# Patient Record
Sex: Female | Born: 1965 | Race: Black or African American | Hispanic: No | Smoking: Never smoker
Health system: Southern US, Community
[De-identification: ages and names within clinical notes are randomized; demographics above are authoritative.]

## PROBLEM LIST (undated history)

## (undated) DIAGNOSIS — R42 Dizziness and giddiness: Secondary | ICD-10-CM

## (undated) DIAGNOSIS — J45909 Unspecified asthma, uncomplicated: Secondary | ICD-10-CM

---

## 2017-04-10 ENCOUNTER — Emergency Department (HOSPITAL_COMMUNITY)
Admission: EM | Admit: 2017-04-10 | Discharge: 2017-04-10 | Disposition: A | Discharge status: Home / Self Care | Payer: No Typology Code available for payment source | Attending: Emergency Medicine | Admitting: Emergency Medicine

## 2017-04-10 ENCOUNTER — Emergency Department (HOSPITAL_COMMUNITY): Payer: No Typology Code available for payment source

## 2017-04-10 ENCOUNTER — Encounter (HOSPITAL_COMMUNITY): Payer: Self-pay

## 2017-04-10 DIAGNOSIS — Y9389 Activity, other specified: Secondary | ICD-10-CM | POA: Insufficient documentation

## 2017-04-10 DIAGNOSIS — R519 Headache, unspecified: Secondary | ICD-10-CM

## 2017-04-10 DIAGNOSIS — R51 Headache: Secondary | ICD-10-CM | POA: Diagnosis not present

## 2017-04-10 DIAGNOSIS — Y998 Other external cause status: Secondary | ICD-10-CM | POA: Insufficient documentation

## 2017-04-10 DIAGNOSIS — J45909 Unspecified asthma, uncomplicated: Secondary | ICD-10-CM | POA: Insufficient documentation

## 2017-04-10 DIAGNOSIS — M542 Cervicalgia: Secondary | ICD-10-CM | POA: Diagnosis not present

## 2017-04-10 DIAGNOSIS — Y92410 Unspecified street and highway as the place of occurrence of the external cause: Secondary | ICD-10-CM | POA: Insufficient documentation

## 2017-04-10 HISTORY — DX: Dizziness and giddiness: R42

## 2017-04-10 HISTORY — DX: Unspecified asthma, uncomplicated: J45.909

## 2017-04-10 MED ORDER — NAPROXEN 500 MG PO TABS: 500.0000 mg | ORAL_TABLET | Freq: Two times a day (BID) | ORAL | 0 refills | Status: DC

## 2017-04-10 MED ORDER — CYCLOBENZAPRINE HCL 10 MG PO TABS: 10.0000 mg | ORAL_TABLET | Freq: Two times a day (BID) | ORAL | 0 refills | Status: DC | PRN

## 2017-04-10 MED ORDER — IBUPROFEN 800 MG PO TABS
800.0000 mg | ORAL_TABLET | Freq: Once | ORAL | Status: AC
  Administered 2017-04-10: 800 mg via ORAL
  Filled 2017-04-10: qty 1

## 2017-04-10 NOTE — Discharge Instructions (Signed)
X-rays of your head and neck were normal. Prescription for pain medicine and muscle relaxer. You will be sore for several days.
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
## 2017-04-10 NOTE — ED Notes (Signed)
Pt verbalized understanding discharge instructions and denies any further needs or questions at this time. VS stable, ambulatory and steady gait.   
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
## 2018-11-23 ENCOUNTER — Encounter (HOSPITAL_COMMUNITY): Payer: Self-pay

## 2018-11-23 ENCOUNTER — Emergency Department (HOSPITAL_COMMUNITY)
Admission: EM | Admit: 2018-11-23 | Discharge: 2018-11-24 | Disposition: A | Discharge status: Home / Self Care | Payer: Medicaid Other | Attending: Emergency Medicine | Admitting: Emergency Medicine

## 2018-11-23 ENCOUNTER — Other Ambulatory Visit: Payer: Self-pay

## 2018-11-23 ENCOUNTER — Emergency Department (HOSPITAL_COMMUNITY): Payer: Medicaid Other

## 2018-11-23 DIAGNOSIS — M79601 Pain in right arm: Secondary | ICD-10-CM | POA: Diagnosis not present

## 2018-11-23 DIAGNOSIS — R05 Cough: Secondary | ICD-10-CM | POA: Diagnosis not present

## 2018-11-23 DIAGNOSIS — Z79899 Other long term (current) drug therapy: Secondary | ICD-10-CM | POA: Insufficient documentation

## 2018-11-23 DIAGNOSIS — M79662 Pain in left lower leg: Secondary | ICD-10-CM | POA: Insufficient documentation

## 2018-11-23 DIAGNOSIS — R2231 Localized swelling, mass and lump, right upper limb: Secondary | ICD-10-CM | POA: Insufficient documentation

## 2018-11-23 DIAGNOSIS — R112 Nausea with vomiting, unspecified: Secondary | ICD-10-CM | POA: Diagnosis not present

## 2018-11-23 DIAGNOSIS — J45909 Unspecified asthma, uncomplicated: Secondary | ICD-10-CM | POA: Diagnosis not present

## 2018-11-23 DIAGNOSIS — R0981 Nasal congestion: Secondary | ICD-10-CM | POA: Diagnosis not present

## 2018-11-23 DIAGNOSIS — R0789 Other chest pain: Secondary | ICD-10-CM | POA: Diagnosis not present

## 2018-11-23 LAB — BASIC METABOLIC PANEL
Anion gap: 8 (ref 5–15)
BUN: 17 mg/dL (ref 6–20)
CHLORIDE: 104 mmol/L (ref 98–111)
CO2: 23 mmol/L (ref 22–32)
Calcium: 8.9 mg/dL (ref 8.9–10.3)
Creatinine, Ser: 0.9 mg/dL (ref 0.44–1.00)
GFR calc Af Amer: >60 mL/min (ref >60)
GLUCOSE: 184 mg/dL — AB (ref 70–99)
POTASSIUM: 3.6 mmol/L (ref 3.5–5.1)
Sodium: 135 mmol/L (ref 135–145)

## 2018-11-23 LAB — CBC
HEMATOCRIT: 40.8 % (ref 36.0–46.0)
HEMOGLOBIN: 12.7 g/dL (ref 12.0–15.0)
MCH: 26.1 pg (ref 26.0–34.0)
MCHC: 31.1 g/dL (ref 30.0–36.0)
MCV: 83.8 fL (ref 80.0–100.0)
Platelets: 350 10*3/uL (ref 150–400)
RBC: 4.87 MIL/uL (ref 3.87–5.11)
RDW: 13.5 % (ref 11.5–15.5)
WBC: 13.7 10*3/uL — ABNORMAL HIGH (ref 4.0–10.5)
nRBC: 0 % (ref 0.0–0.2)

## 2018-11-23 LAB — I-STAT TROPONIN, ED: Troponin i, poc: 0 ng/mL (ref 0.00–0.08)

## 2018-11-23 MED ORDER — SODIUM CHLORIDE 0.9% FLUSH: 3.0000 mL | Freq: Once | INTRAVENOUS | Status: DC

## 2018-11-23 NOTE — ED Triage Notes (Signed)
Pt reports chest pain since yesterday and right forearm pain that has a lump there. Pt first noticed lump on her forearm on Friday. Pt a.o, nad noted.

## 2018-11-24 LAB — I-STAT TROPONIN, ED: TROPONIN I, POC: 0.01 ng/mL (ref 0.00–0.08)

## 2018-11-24 MED ORDER — AZITHROMYCIN 250 MG PO TABS: 250.0000 mg | ORAL_TABLET | Freq: Every day | ORAL | 0 refills | Status: DC

## 2018-11-24 NOTE — Discharge Instructions (Signed)
Follow up with a PCP.  Return for sudden worsening symptoms.   Take 4 over the counter ibuprofen tablets 3 times a day or 2 over-the-counter naproxen tablets twice a day for pain. Also take tylenol 1000mg (2 extra strength) four times a day.

## 2018-11-24 NOTE — ED Provider Notes (Signed)
[facility] EMERGENCY DEPARTMENT Provider Note   CSN: [id number] Arrival date & time: 11/23/18  2003    History   Chief Complaint Chief Complaint  Patient presents with  . Chest Pain  . Arm Pain    HPI Carly Flores is a 53 y.o. female.     53 yo F with a chief complaint of chest pain.  This been off and on for the past couple days.  Last for about a second at a time.  Is sharp and then spontaneously resolves.  Nothing seems to make this come on or go away.  She had an episode a couple days ago where she had profuse vomiting that spontaneously resolved.  After which she had right arm pain that she says starts at the forearm and radiates up to the posterior aspect of the arm.  She denies trauma to the area.  She noticed a bump to the volar right forearm.  She feels that it feels a bit warm.  Denies other break in the skin.  States that she has been coughing and congested for the past 2 or 3 months.  Was told it was allergies by her PCP.  Recently moved from [place].  The history is provided by the patient.  Chest Pain  Pain location:  L chest and R chest Pain quality: aching and sharp   Pain radiates to:  Does not radiate Pain severity:  Severe Onset quality:  Sudden Duration:  2 days Timing:  Intermittent Progression:  Waxing and waning Chronicity:  New Relieved by:  Nothing Worsened by:  Nothing Ineffective treatments:  None tried Associated symptoms: cough, nausea and vomiting   Associated symptoms: no abdominal pain, no dizziness, no fever, no headache, no palpitations and no shortness of breath   Arm Pain  Associated symptoms include chest pain. Pertinent negatives include no abdominal pain, no headaches and no shortness of breath.    Past Medical History:  Diagnosis Date  . Asthma   . Vertigo     There are no active problems to display for this patient.   Past Surgical History:  Procedure Laterality Date  . CESAREAN SECTION     3      OB History   No obstetric history on file.      Home Medications    Prior to Admission medications   Medication Sig Start Date End Date Taking? Authorizing Provider  azithromycin (ZITHROMAX) 250 MG tablet Take 1 tablet (250 mg total) by mouth daily. Take first 2 tablets together, then 1 every day until finished. 11/24/18   Melene Plan, DO  cyclobenzaprine (FLEXERIL) 10 MG tablet Take 1 tablet (10 mg total) by mouth 2 (two) times daily as needed for muscle spasms. 04/10/17   Donnetta Hutching, MD  naproxen (NAPROSYN) 500 MG tablet Take 1 tablet (500 mg total) by mouth 2 (two) times daily. 04/10/17   Donnetta Hutching, MD    Family History No family history on file.  Social History Social History   Tobacco Use  . Smoking status: Never Smoker  . Smokeless tobacco: Never Used  Substance Use Topics  . Alcohol use: No  . Drug use: Not on file     Allergies   Codeine and Tylenol [acetaminophen]   Review of Systems Review of Systems  Constitutional: Negative for chills and fever.  HENT: Positive for congestion. Negative for rhinorrhea.   Eyes: Negative for redness and visual disturbance.  Respiratory: Positive for cough. Negative for shortness of  breath and wheezing.   Cardiovascular: Positive for chest pain. Negative for palpitations.  Gastrointestinal: Positive for nausea and vomiting. Negative for abdominal pain.  Genitourinary: Negative for dysuria and urgency.  Musculoskeletal: Negative for arthralgias and myalgias.  Skin: Negative for pallor and wound.  Neurological: Negative for dizziness and headaches.     Physical Exam Updated Vital Signs BP (!) 159/93   Pulse 92   Temp 98.3 F (36.8 C) (Oral)   Resp 18   Ht 5\' 5"  (1.651 m)   Wt 72.1 kg   SpO2 99%   BMI 26.46 kg/m   Physical Exam Vitals signs and nursing note reviewed.  Constitutional:      General: She is not in acute distress.    Appearance: She is well-developed. She is not diaphoretic.  HENT:     Head:  Normocephalic and atraumatic.  Eyes:     Pupils: Pupils are equal, round, and reactive to light.  Neck:     Musculoskeletal: Normal range of motion and neck supple.  Cardiovascular:     Rate and Rhythm: Normal rate and regular rhythm.     Heart sounds: No murmur. No friction rub. No gallop.   Pulmonary:     Effort: Pulmonary effort is normal.     Breath sounds: No wheezing or rales.  Chest:     Chest wall: Tenderness present.     Comments: Palpation of the left anterior chest wall reproduces the patient symptoms Abdominal:     General: There is no distension.     Palpations: Abdomen is soft.     Tenderness: There is no abdominal tenderness.  Musculoskeletal:     Left lower leg: She exhibits tenderness.     Comments: Tenderness to palpation about the right forearm and the right trapezius.  Full range of motion no bony tenderness.  She has a small nodule to the right dorsal forearm, mildly tender, no erythema no warmth no fluctuance  Skin:    General: Skin is warm and dry.  Neurological:     Mental Status: She is alert and oriented to person, place, and time.  Psychiatric:        Behavior: Behavior normal.      ED Treatments / Results  Labs (all labs ordered are listed, but only abnormal results are displayed) Labs Reviewed  BASIC METABOLIC PANEL - Abnormal; Notable for the following components:      Result Value   Glucose, Bld 184 (*)    All other components within normal limits  CBC - Abnormal; Notable for the following components:   WBC 13.7 (*)    All other components within normal limits  I-STAT TROPONIN, ED  I-STAT TROPONIN, ED    EKG EKG Interpretation  Date/Time:  Tuesday November 23 2018 20:09:54 EST Ventricular Rate:  89 PR Interval:  120 QRS Duration: 72 QT Interval:  350 QTC Calculation: 425 R Axis:   63 Text Interpretation:  Normal sinus rhythm Nonspecific T wave abnormality Abnormal ECG No old tracing to compare Confirmed by Melene Plan [phone]) on  11/24/2018 2:34:41 AM   Radiology Dg Chest 2 View  Result Date: 11/23/2018 CLINICAL DATA:  53 y/o  F; chest pain. EXAM: CHEST - 2 VIEW COMPARISON:  None. FINDINGS: Cardiomegaly. Pulmonary venous hypertension. No consolidation, effusion, or pneumothorax. Bones are unremarkable. IMPRESSION: Cardiomegaly and pulmonary venous hypertension.  No consolidation. Electronically Signed   By: Mitzi Hansen M.D.   On: 11/23/2018 22:25    Procedures Procedures (including critical care  time)  Medications Ordered in ED Medications  sodium chloride flush (NS) 0.9 % injection 3 mL (has no administration in time range)     Initial Impression / Assessment and Plan / ED Course  I have reviewed the triage vital signs and the nursing notes.  Pertinent labs & imaging results that were available during my care of the patient were reviewed by me and considered in my medical decision making (see chart for details).        53 yo F with a chief complaint of chest pain.  This is atypical in nature is reproduced on exam most likely is musculoskeletal.  She also is complaining of right arm pain which I am able to reproduce on exam.  What the patient take Tylenol and NSAIDs.  Have her follow-up with the PCP.  She has a nodule in her arm which seems more likely a histamine reaction than an infection.  No significant erythema warmth or fluctuance.  Her chest x-ray was read as possible volume overload that the patient clinically is not volume overloaded without rales JVD or extremity edema.  As the patient has had cough for couple months and this finding on chest x-ray I must consider atypical pneumonia has a possibility.  We will start her on azithromycin.  4:00 AM:  I have discussed the diagnosis/risks/treatment options with the patient and believe the pt to be eligible for discharge home to follow-up with PCP. We also discussed returning to the ED immediately if new or worsening sx occur. We discussed the sx  which are most concerning (e.g., sudden worsening pain, fever, inability to tolerate by mouth) that necessitate immediate return. Medications administered to the patient during their visit and any new prescriptions provided to the patient are listed below.  Medications given during this visit Medications  sodium chloride flush (NS) 0.9 % injection 3 mL (has no administration in time range)     The patient appears reasonably screen and/or stabilized for discharge and I doubt any other medical condition or other [facility] requiring further screening, evaluation, or treatment in the ED at this time prior to discharge.    Final Clinical Impressions(s) / ED Diagnoses   Final diagnoses:  Atypical chest pain    ED Discharge Orders         Ordered    azithromycin (ZITHROMAX) 250 MG tablet  Daily     11/24/18 0354           Melene Plan, DO 11/24/18 0400

## 2018-11-26 ENCOUNTER — Encounter: Payer: Self-pay | Admitting: Internal Medicine

## 2018-11-26 ENCOUNTER — Ambulatory Visit: Payer: Medicaid Other | Admitting: Internal Medicine

## 2018-11-26 VITALS — BP 180/100 | HR 80 | Temp 98.3°F | Resp 12 | Ht 64.0 in | Wt 164.0 lb

## 2018-11-26 DIAGNOSIS — J4540 Moderate persistent asthma, uncomplicated: Secondary | ICD-10-CM

## 2018-11-26 DIAGNOSIS — L989 Disorder of the skin and subcutaneous tissue, unspecified: Secondary | ICD-10-CM | POA: Diagnosis not present

## 2018-11-26 DIAGNOSIS — B009 Herpesviral infection, unspecified: Secondary | ICD-10-CM

## 2018-11-26 DIAGNOSIS — J3089 Other allergic rhinitis: Secondary | ICD-10-CM

## 2018-11-26 MED ORDER — FLUTICASONE PROPIONATE 50 MCG/ACT NA SUSP: 2.0000 | Freq: Every day | NASAL | 11 refills | Status: DC

## 2018-11-26 MED ORDER — MONTELUKAST SODIUM 10 MG PO TABS: 10.0000 mg | ORAL_TABLET | Freq: Every day | ORAL | 11 refills | Status: DC

## 2018-11-26 MED ORDER — CETIRIZINE HCL 10 MG PO TABS: ORAL_TABLET | ORAL | 11 refills | Status: DC

## 2018-11-26 MED ORDER — ALBUTEROL SULFATE HFA 108 (90 BASE) MCG/ACT IN AERS: 2.0000 | INHALATION_SPRAY | RESPIRATORY_TRACT | 1 refills | Status: AC | PRN

## 2018-11-26 MED ORDER — FLUTICASONE PROPIONATE HFA 44 MCG/ACT IN AERO: 2.0000 | INHALATION_SPRAY | Freq: Two times a day (BID) | RESPIRATORY_TRACT | 11 refills | Status: DC

## 2018-11-26 MED ORDER — VALACYCLOVIR HCL 500 MG PO TABS: 500.0000 mg | ORAL_TABLET | Freq: Two times a day (BID) | ORAL | 11 refills | Status: DC

## 2018-11-26 NOTE — Patient Instructions (Signed)
Dear Carly Flores and Carly Flores's husband:  Thank you for sending [place] your sister/sister in law. I don't want you to be offended, but it is really important for your health and your loved ones that you do not smoke at all (let me emphasize that:  At all--not near an open window or in the bathroom or another room in the house near the vent and with the door closed) as it causes problems with everyone's breathing.  This is even more important for the young ones in the household. Carly Flores--this is something you know I ask everyone, so this is your doctor who brought this up and is so strident about it--NOT your sister. Please help everyone breathe better in your home--including the two of you!
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
## 2019-01-28 ENCOUNTER — Ambulatory Visit: Payer: Medicaid Other | Admitting: Internal Medicine

## 2019-01-28 ENCOUNTER — Other Ambulatory Visit: Payer: Self-pay

## 2019-01-28 ENCOUNTER — Encounter: Payer: Self-pay | Admitting: Internal Medicine

## 2019-01-28 VITALS — BP 162/100 | HR 70 | Resp 12 | Ht 64.0 in | Wt 157.0 lb

## 2019-01-28 DIAGNOSIS — Z1239 Encounter for other screening for malignant neoplasm of breast: Secondary | ICD-10-CM | POA: Diagnosis not present

## 2019-01-28 DIAGNOSIS — I10 Essential (primary) hypertension: Secondary | ICD-10-CM | POA: Diagnosis not present

## 2019-01-28 DIAGNOSIS — R739 Hyperglycemia, unspecified: Secondary | ICD-10-CM

## 2019-01-28 DIAGNOSIS — E1165 Type 2 diabetes mellitus with hyperglycemia: Secondary | ICD-10-CM

## 2019-01-28 DIAGNOSIS — N644 Mastodynia: Secondary | ICD-10-CM

## 2019-01-28 DIAGNOSIS — E785 Hyperlipidemia, unspecified: Secondary | ICD-10-CM

## 2019-01-28 DIAGNOSIS — J4540 Moderate persistent asthma, uncomplicated: Secondary | ICD-10-CM | POA: Diagnosis not present

## 2019-01-28 DIAGNOSIS — Z79899 Other long term (current) drug therapy: Secondary | ICD-10-CM

## 2019-01-28 DIAGNOSIS — J3089 Other allergic rhinitis: Secondary | ICD-10-CM | POA: Diagnosis not present

## 2019-01-28 MED ORDER — AMLODIPINE BESYLATE 5 MG PO TABS
5.0000 mg | ORAL_TABLET | Freq: Every day | ORAL | 11 refills | Status: DC
Start: 2019-01-28 — End: 2019-11-30

## 2019-01-28 MED ORDER — OLOPATADINE HCL 0.2 % OP SOLN: OPHTHALMIC | 11 refills | Status: DC

## 2019-01-28 NOTE — Patient Instructions (Signed)

## 2019-01-28 NOTE — Progress Notes (Addendum)
Subjective:    Patient ID: Carly Flores, female   DOB: 11-Feb-1966, 53 y.o.   MRN: [id number]   HPI   1.  Asthma:  Her sister and her sister's husband continue to smoke in the house.  Both also will go outside, but they continue to smoke inside as well.   She changed the blinds in her room and they are wipeable. Started on her asthma meds the same day seen 11/26/2018. Flovent 44 mcg 2 puffs twice daily. Using Albuterol only as needed.  Last time was at last visit.   Clear, however, she wheezes maybe once every 2 weeks wheezing, but not using the rescue inhaler.   Taking also Montelukast 10 mg daily.  2.  Allergies:  With the pollen, more symptoms.  Lot of tress around the house.  Windows closed.   Fluticasone nasal spray Also Cetirizine 10 mg daily.  Leaving shoes at door, but rest of family not doing this. Eyes are itchy and watery in the morning.  3.  Elevated BP:  Does not want to take medication.  Has been elevated in past and she has changed her lifestyle to bring down.   Eating a lot of fast food and not physically active with COVID19 concerns. Has had CAD work ups in recent past that were negative for CAD.  Will need to get those records.   Sounds like one was a preop evaluation.  4.  Depression:  Mainly regarding lack of job.  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   Now no job.   Sleeping a lot.  Has EBT, but has not applied for other help. She generally has a structured day, but she, her 55 yo and 55 yo sons are spending a lot of time indoors in bed, video and gaming.  Not getting outside for physical activity. Not cooking and eating as family. Her sister, Carly Flores, also a patient here, brings in a lot of fast food.   She does not have a regular schedule with sleep/wake/chores/physical activity/meals.   Current Meds  Medication Sig  . albuterol (PROVENTIL HFA;VENTOLIN HFA) 108 (90 Base)  MCG/ACT inhaler Inhale 2 puffs into the lungs every 4 (four) hours as needed for wheezing or shortness of breath.  Marland Kitchen. albuterol (PROVENTIL) (2.5 MG/3ML) 0.083% nebulizer solution Take 2.5 mg by nebulization every 6 (six) hours as needed for wheezing or shortness of breath.  . cetirizine (ZYRTEC) 10 MG tablet 1 tab by mouth daily for allergies  . ferrous sulfate 325 (65 FE) MG tablet Take 325 mg by mouth daily with breakfast.  . fluticasone (FLONASE) 50 MCG/ACT nasal spray Place 2 sprays into both nostrils daily.  . fluticasone (FLOVENT HFA) 44 MCG/ACT inhaler Inhale 2 puffs into the lungs 2 (two) times daily.  . meclizine (ANTIVERT) 25 MG tablet Take 25 mg by mouth 3 (three) times daily as needed for dizziness.  . montelukast (SINGULAIR) 10 MG tablet Take 1 tablet (10 mg total) by mouth at bedtime.  . valACYclovir (VALTREX) 500 MG tablet Take 1 tablet (500 mg total) by mouth 2 (two) times daily.  Marland Kitchen. VITAMIN D PO Take 10,000 Units by mouth daily.   Allergies  Allergen Reactions  . Codeine   . Tylenol [Acetaminophen]      Review of Systems    Objective:   BP (!) 162/100 (BP Location: Left Arm, Cuff Size: Normal)   Pulse 70  Resp 12   Ht 5\' 4"  (1.626 m)   Wt 157 lb (71.2 kg)   LMP  (LMP Unknown)   PF 270 L/min   BMI 26.95 kg/m   Physical Exam  NAD HEENT:  PERRL, EOMI, TMs pearly gray, throat without injection.   Neck:  Supple, No adenopathy, no thyromegaly Chest:  CTA CV:  RRR with normal S1 and S2,  No S3, S4 or murmur.  Radial and DP pulses normal and equal. Abd:  S, NT, No HS or mass, + BS LE:  No edema.   Assessment & Plan   1.  Asthma:  Improved, but to use rescue inhaler when she is wheezing. Will discuss the in house smoking with Carly Felling when she is in next week.  2.  Allergies:  Add Patanol eye drops.  To call if not covered.  3.  Dysthymia and Stress:  Discussed making her days for herself and her sons more structured. To make goals with applying for financial  support, schedule for the day, including outdoor physical activity with or without her sons, and then perhaps to budget her food costs and becoming the house cook for improved diet.   Try to pull her sons into helping with the meals. To work on sitting down for each meal with her sons at least and also work on meal behavior. Consider cleaning walls in each room on a schedule to get rid of cigarette smoke. To take on one thing at a time so as not to get overwhelmed. She has a Systems developer and asked her to make goal to get started writing down plans in next 48 hours.  4.  Hypertension:  To work on lifestyle changes.  Agrees to try Amlodipine 5 mg daily. CBC, CMP, A1C with history of hyperglycemia, FLP with history of dyslipidemia.  Follow up with nurse in 2 weeks for bp and pulse and with me in 2 months Ordered a screening mammogram on this date, but when imaging called to set up today (01/31/2019), patient told them she was having left breast pain.  Now needs diagnostic mammogram.

## 2019-01-29 LAB — CBC WITH DIFFERENTIAL/PLATELET
Basophils Absolute: 0 10*3/uL (ref 0.0–0.2)
Basos: 0 %
EOS (ABSOLUTE): 0.2 10*3/uL (ref 0.0–0.4)
Eos: 2 %
Hematocrit: 42.5 % (ref 34.0–46.6)
Hemoglobin: 14.3 g/dL (ref 11.1–15.9)
Immature Grans (Abs): 0 10*3/uL (ref 0.0–0.1)
Immature Granulocytes: 0 %
Lymphocytes Absolute: 2.7 10*3/uL (ref 0.7–3.1)
Lymphs: 29 %
MCH: 27.6 pg (ref 26.6–33.0)
MCHC: 33.6 g/dL (ref 31.5–35.7)
MCV: 82 fL (ref 79–97)
Monocytes Absolute: 0.5 10*3/uL (ref 0.1–0.9)
Monocytes: 5 %
Neutrophils Absolute: 6.1 10*3/uL (ref 1.4–7.0)
Neutrophils: 64 %
Platelets: 305 10*3/uL (ref 150–450)
RBC: 5.19 x10E6/uL (ref 3.77–5.28)
RDW: 12.2 % (ref 11.7–15.4)
WBC: 9.5 10*3/uL (ref 3.4–10.8)

## 2019-01-29 LAB — COMPREHENSIVE METABOLIC PANEL
ALT: 14 IU/L (ref 0–32)
AST: 13 IU/L (ref 0–40)
Albumin/Globulin Ratio: 1.8 (ref 1.2–2.2)
Albumin: 4.7 g/dL (ref 3.8–4.9)
Alkaline Phosphatase: 169 IU/L — ABNORMAL HIGH (ref 39–117)
BUN/Creatinine Ratio: 14 (ref 9–23)
BUN: 12 mg/dL (ref 6–24)
Bilirubin Total: 0.4 mg/dL (ref 0.0–1.2)
CO2: 23 mmol/L (ref 20–29)
Calcium: 10.3 mg/dL — ABNORMAL HIGH (ref 8.7–10.2)
Chloride: 99 mmol/L (ref 96–106)
Creatinine, Ser: 0.88 mg/dL (ref 0.57–1.00)
GFR calc Af Amer: 86 mL/min/{1.73_m2} (ref >59)
GFR calc non Af Amer: 75 mL/min/{1.73_m2} (ref >59)
Globulin, Total: 2.6 g/dL (ref 1.5–4.5)
Glucose: 425 mg/dL — ABNORMAL HIGH (ref 65–99)
Potassium: 4.6 mmol/L (ref 3.5–5.2)
Sodium: 139 mmol/L (ref 134–144)
Total Protein: 7.3 g/dL (ref 6.0–8.5)

## 2019-01-29 LAB — LIPID PANEL W/O CHOL/HDL RATIO
Cholesterol, Total: 231 mg/dL — ABNORMAL HIGH (ref 100–199)
HDL: 47 mg/dL (ref >39)
LDL Calculated: 161 mg/dL — ABNORMAL HIGH (ref 0–99)
Triglycerides: 115 mg/dL (ref 0–149)
VLDL Cholesterol Cal: 23 mg/dL (ref 5–40)

## 2019-01-29 LAB — HGB A1C W/O EAG: Hgb A1c MFr Bld: 11.9 % — ABNORMAL HIGH (ref 4.8–5.6)

## 2019-01-31 ENCOUNTER — Other Ambulatory Visit: Payer: Self-pay | Admitting: Internal Medicine

## 2019-01-31 DIAGNOSIS — N644 Mastodynia: Secondary | ICD-10-CM

## 2019-01-31 MED ORDER — ACCU-CHEK MULTICLIX LANCETS MISC: 11 refills | Status: DC

## 2019-01-31 MED ORDER — ACCU-CHEK AVIVA PLUS W/DEVICE KIT: PACK | 0 refills | Status: AC

## 2019-01-31 MED ORDER — GLIPIZIDE 5 MG PO TABS: ORAL_TABLET | ORAL | 11 refills | Status: DC

## 2019-01-31 MED ORDER — GLUCOSE BLOOD VI STRP: ORAL_STRIP | 11 refills | Status: DC

## 2019-01-31 MED ORDER — METFORMIN HCL ER 500 MG PO TB24: ORAL_TABLET | ORAL | 11 refills | Status: DC

## 2019-01-31 NOTE — Addendum Note (Signed)
Addended by: Marcene Duos on: 01/31/2019 12:28 PM   Modules accepted: Orders

## 2019-01-31 NOTE — Addendum Note (Signed)
Addended by: Marcene Duos on: 01/31/2019 12:18 PM   Modules accepted: Orders

## 2019-02-02 ENCOUNTER — Ambulatory Visit: Payer: Medicaid Other | Admitting: Internal Medicine

## 2019-02-11 ENCOUNTER — Encounter: Payer: Self-pay | Admitting: Internal Medicine

## 2019-02-11 ENCOUNTER — Ambulatory Visit: Payer: Medicaid Other | Admitting: Internal Medicine

## 2019-02-11 ENCOUNTER — Other Ambulatory Visit: Payer: Self-pay

## 2019-02-11 VITALS — BP 130/86 | HR 62 | Resp 12 | Ht 64.0 in | Wt 156.0 lb

## 2019-02-11 DIAGNOSIS — E1165 Type 2 diabetes mellitus with hyperglycemia: Secondary | ICD-10-CM | POA: Diagnosis not present

## 2019-02-11 DIAGNOSIS — I10 Essential (primary) hypertension: Secondary | ICD-10-CM | POA: Diagnosis not present

## 2019-02-11 DIAGNOSIS — E7800 Pure hypercholesterolemia, unspecified: Secondary | ICD-10-CM | POA: Diagnosis not present

## 2019-02-11 NOTE — Progress Notes (Signed)
Subjective:    Patient ID: Carly Flores, female   DOB: 07/10/1966, 53 y.o.   MRN: [id number]   HPI   1.  New diagnosis of DM, Type 2:  Started on Metformin XR and Glipizide on the 12th of May.   Is having some loose stool after taking the Metformin in the morning, but does not have that in the evening. Has been able to make changes to diet.   She does have some EBT. She does have her blood sugar diary today. Sugars are already down into the 100s almost all the time.  Her mornings are all under 150 in past week.  In the evening, gets up to low 200s, but not in last 4 days. She has bought a bike and has been using for physical activity. Discussed complications of DM and monitoring, including yearly eye checks, yearly influenza vaccines, pneumococcal vaccine, checking her feet nightly. Having some blurry vision with drops in blood glucose.  Discussed this is not unusual.  Current Meds  Medication Sig  . albuterol (PROVENTIL HFA;VENTOLIN HFA) 108 (90 Base) MCG/ACT inhaler Inhale 2 puffs into the lungs every 4 (four) hours as needed for wheezing or shortness of breath.  Marland Kitchen albuterol (PROVENTIL) (2.5 MG/3ML) 0.083% nebulizer solution Take 2.5 mg by nebulization every 6 (six) hours as needed for wheezing or shortness of breath.  Marland Kitchen amLODipine (NORVASC) 5 MG tablet Take 1 tablet (5 mg total) by mouth daily.  . Blood Glucose Monitoring Suppl (ACCU-CHEK AVIVA PLUS) w/Device KIT Check blood glucose twice daily before meals  . cetirizine (ZYRTEC) 10 MG tablet 1 tab by mouth daily for allergies  . ferrous sulfate 325 (65 FE) MG tablet Take 325 mg by mouth daily with breakfast.  . fluticasone (FLONASE) 50 MCG/ACT nasal spray Place 2 sprays into both nostrils daily.  . fluticasone (FLOVENT HFA) 44 MCG/ACT inhaler Inhale 2 puffs into the lungs 2 (two) times daily.  Marland Kitchen glipiZIDE (GLUCOTROL) 5 MG tablet 1 tab by mouth twice daily with meals  . glucose blood (ACCU-CHEK AVIVA PLUS) test strip Check blood  sugar twice daily before meals.  . Lancets (ACCU-CHEK MULTICLIX) lancets Check blood glucose twice daily before meals  . meclizine (ANTIVERT) 25 MG tablet Take 25 mg by mouth 3 (three) times daily as needed for dizziness.  . metFORMIN (GLUCOPHAGE-XR) 500 MG 24 hr tablet 1 tab by mouth twice daily with meals  . montelukast (SINGULAIR) 10 MG tablet Take 1 tablet (10 mg total) by mouth at bedtime.  . Multiple Vitamin (MULTIVITAMIN) tablet Take 1 tablet by mouth daily.  . Olopatadine HCl 0.2 % SOLN 1 drop both eyes once daily.  . valACYclovir (VALTREX) 500 MG tablet Take 1 tablet (500 mg total) by mouth 2 (two) times daily.  Marland Kitchen VITAMIN D PO Take 10,000 Units by mouth daily.   Allergies  Allergen Reactions  . Codeine   . Tylenol [Acetaminophen]      Review of Systems    Objective:   BP 130/86 (BP Location: Left Arm, Patient Position: Sitting, Cuff Size: Normal)   Pulse 62   Resp 12   Ht _0  (1.626 m)   Wt 156 lb (70.8 kg)   LMP  (LMP Unknown)   BMI 26.78 kg/m   Physical Exam  NAD   Assessment & Plan  1.  New diagnosis of DM type 2:  Very motivated and doing quite well with glucose control over a very short period of time. To call if loose  stools worsen with Metformin XR Continue with lifestyle changes A1C, urine microalbumin/crea in 3 months.  2.  Hypertension:  Much improved control  Discussed goal of 120/70 range.  3.  Hypercholesterolemia:  Discussed goal of total less than 200 and LDL less than 70  Recheck FLP in 3 months.  4.  Asthma:  Sister and husband still smoking in home at night.  She would like to move to her own place. Referral to [facility] via [employer]--gave her phone number to call herself.  5.  HM:  Patient would prefer to wait on immunizations:  Tdap and pneumococcal.

## 2019-02-11 NOTE — Patient Instructions (Signed)
I will put in a referral, but you can call and get started on your own with [employer]:  [phone]  Also:  Try [employer] on [address] and [address] and [employer] on [address].

## 2019-03-11 ENCOUNTER — Other Ambulatory Visit: Payer: Self-pay | Admitting: Internal Medicine

## 2019-03-11 ENCOUNTER — Other Ambulatory Visit: Payer: Self-pay

## 2019-03-11 ENCOUNTER — Ambulatory Visit
Admission: RE | Admit: 2019-03-11 | Discharge: 2019-03-11 | Disposition: A | Discharge status: Home / Self Care | Payer: Medicaid Other | Source: Ambulatory Visit | Attending: Internal Medicine | Admitting: Internal Medicine

## 2019-03-11 DIAGNOSIS — N644 Mastodynia: Secondary | ICD-10-CM

## 2019-03-30 ENCOUNTER — Ambulatory Visit: Payer: Medicaid Other | Admitting: Internal Medicine

## 2019-04-14 ENCOUNTER — Telehealth: Payer: Self-pay | Admitting: Licensed Clinical Social Worker

## 2019-04-29 ENCOUNTER — Other Ambulatory Visit: Payer: Self-pay

## 2019-04-29 ENCOUNTER — Ambulatory Visit (INDEPENDENT_AMBULATORY_CARE_PROVIDER_SITE_OTHER): Payer: Self-pay | Admitting: Licensed Clinical Social Worker

## 2019-04-29 DIAGNOSIS — F411 Generalized anxiety disorder: Secondary | ICD-10-CM

## 2019-05-05 NOTE — Telephone Encounter (Signed)
Spoke with patient to inform her of two possible rental properties in the area that accept unemployment benefits as source of income. [facility] and [facility]; [facility] will not have an apartment ready until the end of September/October 2020 and [facility] until Nov. 10th, 2020.  Patient is still looking for employment and is having trouble with her car.  Patient will contact apartment complexes for more information.

## 2019-05-06 ENCOUNTER — Ambulatory Visit: Payer: Self-pay | Admitting: Licensed Clinical Social Worker

## 2019-05-06 DIAGNOSIS — F330 Major depressive disorder, recurrent, mild: Secondary | ICD-10-CM

## 2019-05-06 DIAGNOSIS — F411 Generalized anxiety disorder: Secondary | ICD-10-CM

## 2019-05-12 ENCOUNTER — Ambulatory Visit (INDEPENDENT_AMBULATORY_CARE_PROVIDER_SITE_OTHER): Payer: Self-pay | Admitting: Licensed Clinical Social Worker

## 2019-05-12 ENCOUNTER — Other Ambulatory Visit: Payer: Self-pay

## 2019-05-12 ENCOUNTER — Other Ambulatory Visit (INDEPENDENT_AMBULATORY_CARE_PROVIDER_SITE_OTHER): Payer: Medicaid Other

## 2019-05-12 DIAGNOSIS — E1165 Type 2 diabetes mellitus with hyperglycemia: Secondary | ICD-10-CM | POA: Diagnosis not present

## 2019-05-12 DIAGNOSIS — E7800 Pure hypercholesterolemia, unspecified: Secondary | ICD-10-CM

## 2019-05-12 DIAGNOSIS — F411 Generalized anxiety disorder: Secondary | ICD-10-CM

## 2019-05-13 LAB — MICROALBUMIN / CREATININE URINE RATIO
Creatinine, Urine: 131.1 mg/dL
Microalb/Creat Ratio: 5 mg/g creat (ref 0–29)
Microalbumin, Urine: 6.4 ug/mL

## 2019-05-13 LAB — LIPID PANEL W/O CHOL/HDL RATIO
Cholesterol, Total: 148 mg/dL (ref 100–199)
HDL: 33 mg/dL — ABNORMAL LOW (ref >39)
LDL Calculated: 88 mg/dL (ref 0–99)
Triglycerides: 134 mg/dL (ref 0–149)
VLDL Cholesterol Cal: 27 mg/dL (ref 5–40)

## 2019-05-13 LAB — HGB A1C W/O EAG: Hgb A1c MFr Bld: 5.9 % — ABNORMAL HIGH (ref 4.8–5.6)
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
## 2019-05-14 NOTE — Clinical Social Work Psych Assess (Signed)
INFORMATION  Client name: Carly Flores Date of birth: 1966/05/29    RISK ASSESSMENT (mark with X if present)  Current danger to self Thoughts of suicide/death:  Self-harming behaviors:    Suicide attempt:  Has plan:    Comments/clarify:       Past danger to self Thoughts of suicide/death:  Self-harming behaviors:    Suicide attempt:  Family history of suicide:    Comments/clarify:      Current danger to others Thoughts to harm others:  Plans to harm others:    Threats to harm others:  Attempt to harm others:    Comments/clarify:      Past danger to others Thoughts to harm others:  Plans to harm others:    Threats to harm others:  Attempt to harm others:    Comments/clarify:     RISK TO SELF Low to no risk: X Moderate risk:  Severe risk:   RISK TO OTHERS Low to no risk: X Moderate risk:  Severe risk:      MENTAL STATUS (mark with X if observed)  APPEARANCE/DRESS  Neat:  Good hygiene: x Age appropriate: x   Sloppy:  Fair hygiene:  Eccentric:    Relaxed: x Poor hygiene:       BEHAVIOR Attentive: x Passive:   Adequate eye contact: x   Guarded:  Defensive: x Minimal eye contact:    Cooperative:  Hostile/irritable: x No eye contact:     MOTOR Hyper:  Hypo:  Rapid:    Agitated: x Tics:  Tremors:    Lethargic:  Calm:       LANGUAGE Unremarkable:  Pressured:  Expressive intact: x   Mute:  Slurred:  Receptive intact: x    AFFECT/MOOD  Calm:  Anxious: x Inappropriate:    Depressed:  Flat:  Elevated:    Labile:  Agitated: x Hypervigilant:     THOUGHT FORM Unremarkable: x Illogical:  Indecisive:    Circumstantial:  Flight of ideas:  Loose associations:    Obsessive thinking:  Distractible:  Tangential:      THOUGHT CONTENT Unremarkable: x Suicidal:  Obsessions:    Homicidal:  Delusions:  Hallucinations:    Suspicious:  Grandiose:  Phobias:      ORIENTATION Fully oriented: x Not oriented to person:  Not oriented to place:    Not oriented to time:  Not oriented to  situation:        ATTENTION/ CONCENTRATION Adequate: x Mildly distractible:  Moderately distractible:    Severely distractible:  Problems concentrating:        INTELLECT Suspected above average:  Suspected average: x Suspected below average:    Known disability:  Uncertain:        MEMORY Within normal limits: x Impaired:  Selective:      PERCEPTIONS Unremarkable: x Auditory hallucinations:  Visual hallucinations:    Dissociation:  Traumatic flashbacks:  Ideas of reference:      JUDGEMENT Poor:  Fair: x Good:      INSIGHT Poor: x Fair:  Good:      IMPULSE CONTROL Adequate: x Needs to be addressed:  Poor:         CLINICAL ASSESSMENT (risk of harm, recovery environment, functional status, diagnostic criteria met)  Risk of harm: lLow   Recovery environment: Fair   Functional status: No limitations Diagnostic criteria met: Yes    DIAGNOSIS   DSM-5 Code ICD-10 Code Diagnosis  300.02  F41.1 Generalized Anxiety Disorder  296.21  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   Patient requires assistance finding secure and affordable housing and employment.                SIGNATURE  Name of clinician: Dannielle Karvonen, MSW, MA  Date:   04/29/2019  Name of supervisor: N/A Date:

## 2019-05-16 ENCOUNTER — Ambulatory Visit: Payer: Medicaid Other | Admitting: Internal Medicine

## 2019-05-19 ENCOUNTER — Ambulatory Visit: Payer: Self-pay | Admitting: Licensed Clinical Social Worker

## 2019-05-19 ENCOUNTER — Other Ambulatory Visit: Payer: Self-pay

## 2019-05-19 DIAGNOSIS — F411 Generalized anxiety disorder: Secondary | ICD-10-CM

## 2019-05-19 DIAGNOSIS — F330 Major depressive disorder, recurrent, mild: Secondary | ICD-10-CM
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
## 2019-05-22 NOTE — Clinical Social Work Note (Signed)
Client Name:  Carly Flores  Date:  05/12/2019  Subjective Complaint  Patient states she is becoming paranoid about the virus because she is constantly hearing about people getting sick.  Patient states she gets annoyed and anxious when she sees people not wearing a mask or not wearing their mask properly.  Patient states she gets anxious when people do not keep their 6' distance.  Patient states she's been thinking about our conversation on expectations and she has been trying to practice it but she is having a hard time doing it because she feels bad if she doesn't have expectations for her sons then how are they supposed to have good life or be successful? Patient is still worried about her sister's health and how she's going to manage when she moves out because she helps sister out a lot. Patient states she would like to have her own place in the next couple of months but she is still having a difficult time finding work.  Patient states she has not applies to that many positions but that she will do that this week. Patient states once she is settled in her own place, she will try to help her sister out financially.  Patient states work is going well for oldest son but she wishes he could drive himself to work but does not believe he would be able to manage it. Patient youngest son started school this week and so far, everything is okay but that she wishes he has his own space to study and have online class.  Patient states youngest son is still dealing with skin condition and is still anxious about COVID-19 and getting sick or having her get sick.  Objective Finding  Patient had an increase in agitation from last session and was more irritable. Patient had adequate eye contact through out session but thought process was circumstantial.   Assessment of Progress  Spoke with patient about goals to contact management companies about available housing.  Patient has contact two locations but has not applied  for housing in either place. Spoke with patient about taking precautions with COVID-19 and emphasizing that she is only responsible for her actions.  Patient was still slightly agitated when speaking about other's behavior when out in public, but was less abrasive after we spoke about her personal precautions.  We discussed her desire to help sister out financially and how that would impact her plans to move out of the house.  We discussed the difficulties in her helping her sister out even after she moved out since she needs to establish herself and find work. We spoke about needing to accept her sister's choices were not hers to fix and how difficult it would be for her to help her sister if she has not taken care of needs first.  Patient agreed that she would need to take care of the needs of herself and her sons first before helping her sister and that her sister's choices were not her responsibility to fix. We discussed her financial situation and the need for her to create a budget to have a clear idea of how much she will need to save up so she can move out.  Plans for Next Session Patient will create a budget. Patient will use grounding techniques and I statements when she upset.  Patient will remind herself that she is being responsible and taking precautions and she is not responsible for anyone outside of her immediate family.     Client  Description (mark with X if observed)  APPEARANCE/DRESS  Neat:  Good hygiene: x Age appropriate:  x   Sloppy:  Fair hygiene:   Eccentric:    Relaxed:  x Poor hygiene:       BEHAVIOR Attentive:  x Passive:   Adequate eye contact:  x   Guarded:   Defensive:   Minimal eye contact:    Cooperative:  Hostile/irritable: x No eye contact:     MOTOR Hyper:  Hypo:  Rapid:    Agitated:  x Tics:  Tremors:    Lethargic:  Calm:       LANGUAGE Unremarkable: x Pressured:  Expressive intact:     Mute:  Slurred:  Receptive intact:      AFFECT/ MOOD  Calm:  Anxious:  x  Inappropriate:    Depressed:   Flat:  Elevated:    Labile:  Agitated: x Hypervigilant:     THOUGHT FORM Unremarkable:  Illogical:  Indecisive:    Circumstantial: x Flight of ideas:  Loose associations:     Obsessive thinking:  Distractible:  Tangential:      THOUGHT CONTENT Unremarkable:  x Suicidal:  Obsessions:    Homicidal:  Delusions:  Hallucinations:    Suspicious:  Grandiose:  Phobias:      ORIENTATION Fully oriented:  x Not oriented to person:  Not oriented to place:    Not oriented to time:  Not oriented to situation:        ATTENTION/ CONCENTRATION Adequate: x Mildly distractible:   Moderately distractible:    Severely distractible:  Problems concentrating:        MEMORY Within normal limits:  x Impaired:  Selective:      PERCEPTIONS Unremarkable:  x Auditory hallucinations:  Visual hallucinations:    Dissociation:  Traumatic flashbacks:  Ideas of reference:      JUDGEMENT Poor:  x Fair:  Good:      INSIGHT Poor:  x Fair:  Good:      IMPULSE CONTROL Adequate: x Needs to be addressed:   Poor:
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
## 2019-05-26 ENCOUNTER — Telehealth: Payer: Self-pay | Admitting: Licensed Clinical Social Worker

## 2019-05-26 NOTE — Telephone Encounter (Signed)
Social worker called patient to give the patient information on property for rent. Patient states she will update me on any new information pertaining to housing.

## 2019-05-27 ENCOUNTER — Other Ambulatory Visit: Payer: Medicaid Other | Admitting: Licensed Clinical Social Worker

## 2019-05-27 DIAGNOSIS — F411 Generalized anxiety disorder: Secondary | ICD-10-CM | POA: Insufficient documentation

## 2019-05-31 ENCOUNTER — Encounter: Payer: Self-pay | Admitting: Internal Medicine

## 2019-05-31 ENCOUNTER — Ambulatory Visit (INDEPENDENT_AMBULATORY_CARE_PROVIDER_SITE_OTHER): Payer: Medicaid Other | Admitting: Internal Medicine

## 2019-05-31 ENCOUNTER — Telehealth: Payer: Self-pay | Admitting: Internal Medicine

## 2019-05-31 ENCOUNTER — Other Ambulatory Visit: Payer: Self-pay

## 2019-05-31 VITALS — BP 126/88 | HR 72 | Resp 12 | Ht 64.0 in | Wt 152.0 lb

## 2019-05-31 DIAGNOSIS — E7800 Pure hypercholesterolemia, unspecified: Secondary | ICD-10-CM | POA: Diagnosis not present

## 2019-05-31 DIAGNOSIS — S161XXA Strain of muscle, fascia and tendon at neck level, initial encounter: Secondary | ICD-10-CM

## 2019-05-31 DIAGNOSIS — F330 Major depressive disorder, recurrent, mild: Secondary | ICD-10-CM | POA: Insufficient documentation

## 2019-05-31 DIAGNOSIS — E1165 Type 2 diabetes mellitus with hyperglycemia: Secondary | ICD-10-CM | POA: Diagnosis not present

## 2019-05-31 DIAGNOSIS — I10 Essential (primary) hypertension: Secondary | ICD-10-CM | POA: Diagnosis not present

## 2019-05-31 DIAGNOSIS — J3089 Other allergic rhinitis: Secondary | ICD-10-CM | POA: Diagnosis not present

## 2019-05-31 DIAGNOSIS — R198 Other specified symptoms and signs involving the digestive system and abdomen: Secondary | ICD-10-CM

## 2019-05-31 NOTE — Patient Instructions (Signed)
Bite block at [facility] to wear at night.  Stretches of your sternocleidomastoid muscle twice daily after applying heating pad for 20 minutes

## 2019-05-31 NOTE — Progress Notes (Signed)
Subjective:    Patient ID: Carly Flores, female   DOB: 09-27-1965, 53 y.o.   MRN: [id number]   HPI   1.  Tickle in her throat and chronic cough.  Using Flonase twice daily instead of once. Is using Montelukast daily, but not zyrtec.    Uses the latter only when needed.    2.  DM  A1C down from 11.9 to 5.9% last month.  Missed her follow up appt in August.    3.  Hyperlipidemia:  Discussed going in the correct general direction, though not at goal.   Lipid Panel     Component Value Date/Time   CHOL 148 05/12/2019 0915   TRIG 134 05/12/2019 0915   HDL 33 (L) 05/12/2019 0915   LDLCALC 88 05/12/2019 0915   4.  Housing:  Has names from Janie Morning, MSW intern, to contact for housing.  Give  [address] as well.   5. Right ear keeps "clogging up" and feeling like something crawling around in her ear. Also, jaw clicks and sometimes locks. She does clench her teeth--not clear if she grinds at night, but wakes clenching.  She did grind her teeth when younger.    Current Meds  Medication Sig  . albuterol (PROVENTIL HFA;VENTOLIN HFA) 108 (90 Base) MCG/ACT inhaler Inhale 2 puffs into the lungs every 4 (four) hours as needed for wheezing or shortness of breath.  Marland Kitchen albuterol (PROVENTIL) (2.5 MG/3ML) 0.083% nebulizer solution Take 2.5 mg by nebulization every 6 (six) hours as needed for wheezing or shortness of breath.  Marland Kitchen amLODipine (NORVASC) 5 MG tablet Take 1 tablet (5 mg total) by mouth daily.  . Blood Glucose Monitoring Suppl (ACCU-CHEK AVIVA PLUS) w/Device KIT Check blood glucose twice daily before meals  . cetirizine (ZYRTEC) 10 MG tablet 1 tab by mouth daily for allergies  . ferrous sulfate 325 (65 FE) MG tablet Take 325 mg by mouth daily with breakfast.  . fluticasone (FLONASE) 50 MCG/ACT nasal spray Place 2 sprays into both nostrils daily.  . fluticasone (FLOVENT HFA) 44 MCG/ACT inhaler Inhale 2 puffs into the lungs 2 (two) times daily.  Marland Kitchen glipiZIDE (GLUCOTROL) 5 MG tablet  1 tab by mouth twice daily with meals  . glucose blood (ACCU-CHEK AVIVA PLUS) test strip Check blood sugar twice daily before meals.  . Lancets (ACCU-CHEK MULTICLIX) lancets Check blood glucose twice daily before meals  . meclizine (ANTIVERT) 25 MG tablet Take 25 mg by mouth 3 (three) times daily as needed for dizziness.  . metFORMIN (GLUCOPHAGE-XR) 500 MG 24 hr tablet 1 tab by mouth twice daily with meals  . montelukast (SINGULAIR) 10 MG tablet Take 1 tablet (10 mg total) by mouth at bedtime.  . Multiple Vitamin (MULTIVITAMIN) tablet Take 1 tablet by mouth daily.  . Olopatadine HCl 0.2 % SOLN 1 drop both eyes once daily.  . valACYclovir (VALTREX) 500 MG tablet Take 1 tablet (500 mg total) by mouth 2 (two) times daily.  Marland Kitchen VITAMIN D PO Take 10,000 Units by mouth daily.   Allergies  Allergen Reactions  . Codeine   . Tylenol [Acetaminophen]      Review of Systems    Objective:   BP 126/88 (BP Location: Left Arm, Patient Position: Sitting, Cuff Size: Normal)   Pulse 72   Resp 12   Ht '5\' 4"'  (1.626 m)   Wt 152 lb (68.9 kg)   LMP  (LMP Unknown)   BMI 26.09 kg/m   Physical Exam  NAD  HEENT:  PERRL, EOMI.   No posterior pharyngeal cobbling.  Nasal mucosa slightly boggy.  TMs pearly gray, canals without erythema. NT with palpation of TMJ.  No clicking or grinding particularly on right. Point to retroauricular area and SCM insertion, where she also has point tenderness. Tender along length of SCM muscle on right. Neck:  As above, supple, No adenopathy Chest:  CTA CV:  RRR without murmur or rub.  Radial and DP pulses normal and equal. LE without edema.   Assessment & Plan  1.  Allergies/Asthma:  Encouraged to use her Zyrtec regularly if symptomatic.  2.  DM:  Excellent control of her DM in a very short time.  Encouraged continuation of lifestyle changes. Consider discontinuation of glipizide.  Patient would like to continue for now along with Metformin.  3.  Hypertension:   Controlled.   4.  Housing issues:  Not clear at this point what funds she has available for housing.  Previously, stated had an entire year of rent.  Does not sound like that is the case now.  5.  Right SCM muscle strain:  Went over stretches.  This seems to be what she is complaining regarding her right ear.    6.  Hypercholesterolemia:  LDL not quite at goal.  To continue to work on diet and exercise.

## 2019-06-06 ENCOUNTER — Other Ambulatory Visit: Payer: Medicaid Other | Admitting: Licensed Clinical Social Worker

## 2019-06-08 ENCOUNTER — Other Ambulatory Visit: Payer: Self-pay

## 2019-06-08 ENCOUNTER — Ambulatory Visit (INDEPENDENT_AMBULATORY_CARE_PROVIDER_SITE_OTHER): Payer: Self-pay | Admitting: Licensed Clinical Social Worker

## 2019-06-08 DIAGNOSIS — F331 Major depressive disorder, recurrent, moderate: Secondary | ICD-10-CM

## 2019-06-08 DIAGNOSIS — F411 Generalized anxiety disorder: Secondary | ICD-10-CM

## 2019-06-08 NOTE — Progress Notes (Signed)
THERAPY PROGRESS NOTE  Session Time: 10:15  Participation Level: Active  Behavioral Response: Casual and Fairly GroomedAlertAnxious and Irritable  Type of Therapy: Individual Therapy  Treatment Goals addressed: Communication , Goal setting  Interventions: Solution Focused and Strength-based  Summary: Carly Flores is a 53 y.o. female who presents with anxiety and depression, and has trouble making goals and communicating needs with family. Patient mood, affect and eye contact are within normal range.   Patient states she spoke with her eldest son "D", about helping out financially. Patient states "D" agreed to give her 200.00 each paycheck and an additional 50.00 dollars for gas.  Patient states she has not been able to find a house to rent.  Patient states she mentioned the [place] suggestion to find a 2 bedroom home to her PCP, and PCP suggested that a 3 bedroom home was more appropriate for her and her sons. Patient states that she would rather find a 3 bedroom home so they can each have their own bedrooms.  Patient states job and house in [place] is still an option and she often thinks about taking the offer.  Patient states her main concern is that "D" has a job, and "J" is doing well in school, and does not want to move again.  Patient states she is still having trouble concentrating and she forgets things.  Patient states she is still stressed out about her diabetes diagnosis and the environment at her sister's house is not helping her stress.  Patient states that she knows she needs to save money and move out of her sister's house, but she also feels guilty about leaving her sister.  Suicidal/Homicidal: Nowithout intent/plan  Therapist Response: SWK asked patient how she felt about the suggestion made by her PCP, to find a three bedroom house.  Patient enthusiastically agreed a 3 bedroom would be better for them, and that it was her preference.  SWK agreed with patient that a 3 bedroom house  would be more comfortable, but their goals was to ensure she could move out of her sister's house as soon as possible and a 2 bedroom home would be easier and cheaper to find quickly.. Patient agreed that a 2 bedroom house would be cheaper and easier to find, and expressed her desire to have a home of her own, was more important than each of them having their own room.  SWK, encouraged patient do do what she thought best for herself and her family.  Patient agreed that she would continue to look for both 2 and 3 bedroom houses/apartments, to have more choices.  SWK and patient worked on a budget to have an estimate of when she would be able to rent a home/apartment.  Patient agreed that she will speak with "D", to increase the amount he gives her every month, to meet the budget needs. SWK spoke with patient about using coping skills for anxiety and finding time during the day to get some physical activity and time for herself.  Patient seemed calmer and more enthusiastic about moving out of her sister's house with a budget plan in place.    Plan: Return again in 1 weeks. Patient will use budget to save for a rental home.  Patient will use coping skills to help with anxiety and will find time to do physical activity and  To spend some time alone.  Diagnosis: Axis I:  Major Depressive Disorder - moderate recurring, Generalized Anxiety Disroder    Axis II:  Joseph Arteresita Jazelyn Sipe, LCSWA 06/08/2019

## 2019-06-15 ENCOUNTER — Other Ambulatory Visit: Payer: Medicaid Other | Admitting: Licensed Clinical Social Worker

## 2019-06-16 ENCOUNTER — Other Ambulatory Visit: Payer: Self-pay | Admitting: Licensed Clinical Social Worker

## 2019-06-16 DIAGNOSIS — F331 Major depressive disorder, recurrent, moderate: Secondary | ICD-10-CM | POA: Insufficient documentation

## 2019-06-24 ENCOUNTER — Other Ambulatory Visit: Payer: Self-pay

## 2019-06-24 ENCOUNTER — Ambulatory Visit (INDEPENDENT_AMBULATORY_CARE_PROVIDER_SITE_OTHER): Payer: Self-pay | Admitting: Licensed Clinical Social Worker

## 2019-06-24 DIAGNOSIS — F411 Generalized anxiety disorder: Secondary | ICD-10-CM

## 2019-06-24 DIAGNOSIS — F331 Major depressive disorder, recurrent, moderate: Secondary | ICD-10-CM
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
## 2019-07-01 ENCOUNTER — Other Ambulatory Visit: Payer: Self-pay | Admitting: Licensed Clinical Social Worker

## 2019-07-28 IMAGING — MG DIGITAL DIAGNOSTIC BILATERAL MAMMOGRAM WITH TOMO AND CAD
8 series · 8 of 24 positions shown · non-contrast
Comparison: None.

CLINICAL DATA: 52-year-old female with intermittent, diffuse left
breast pain for several months.

EXAM:
DIGITAL DIAGNOSTIC BILATERAL MAMMOGRAM WITH CAD AND TOMO
ULTRASOUND BILATERAL BREAST

[R MLO synth-2D]
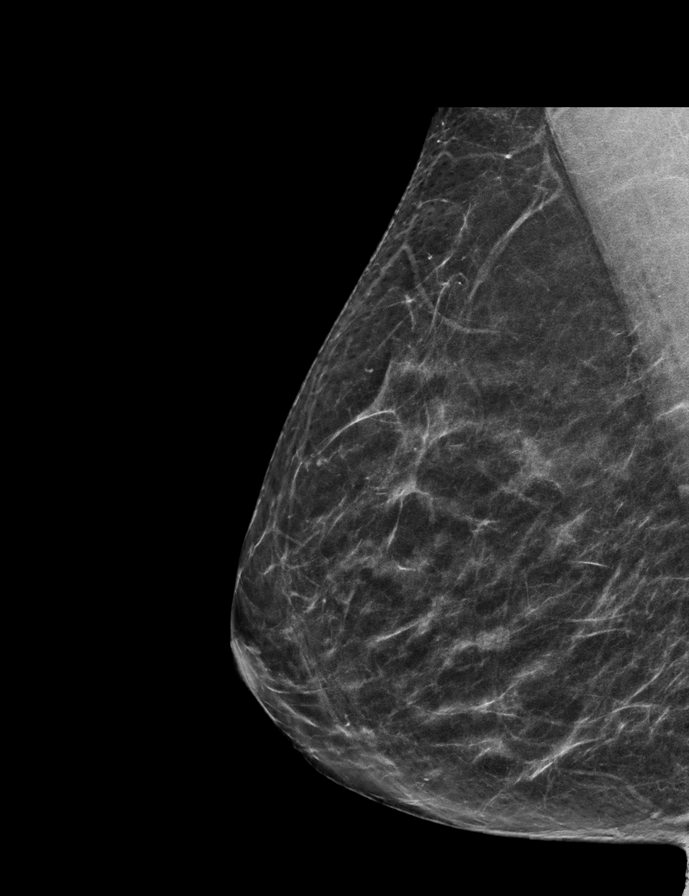

[L CC synth-2D]
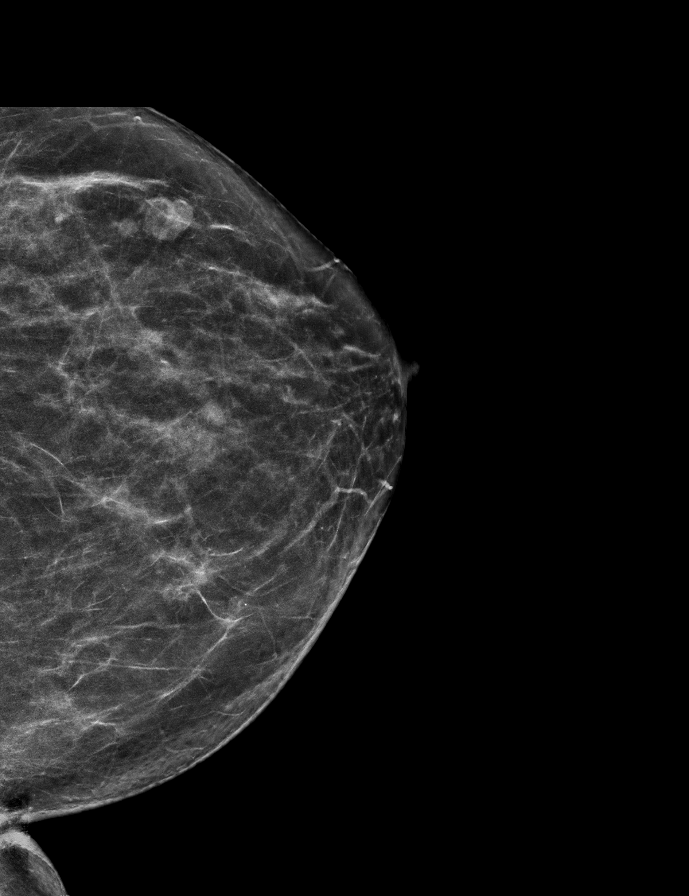

[R CC synth-2D]
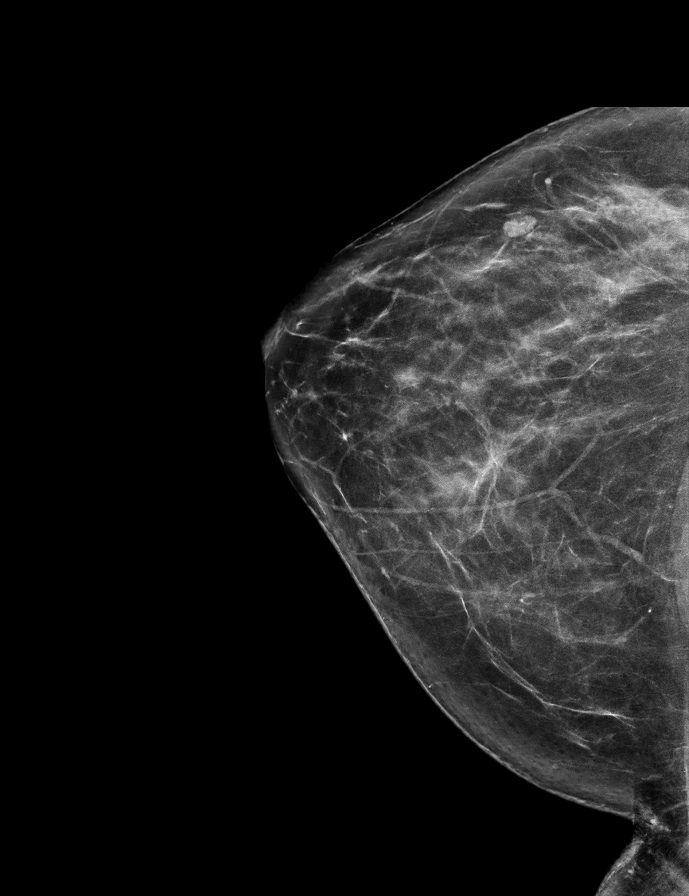

[L MLO synth-2D]
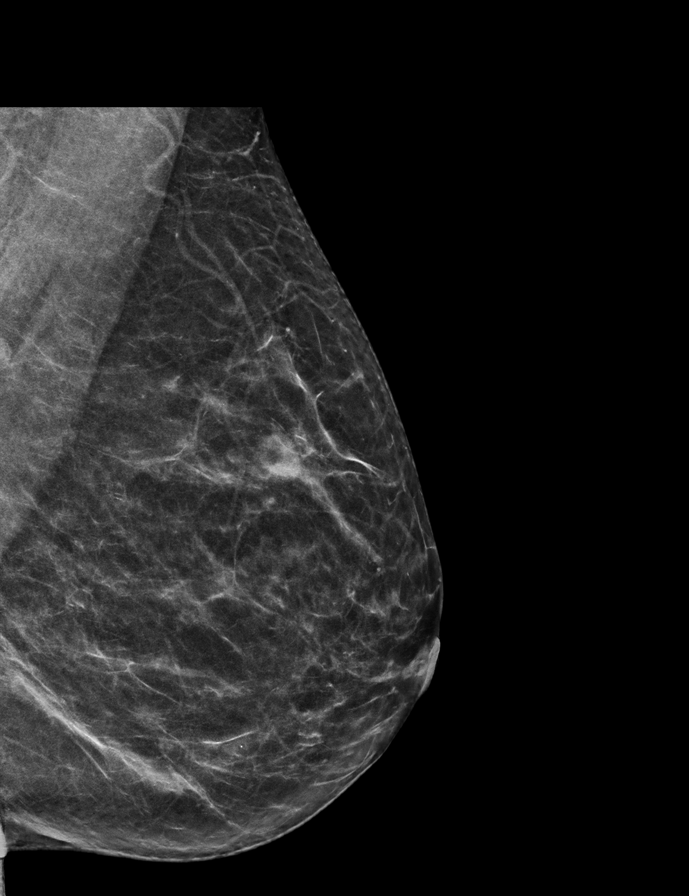

[R MLO tomo · tomo slice 29/58.0]
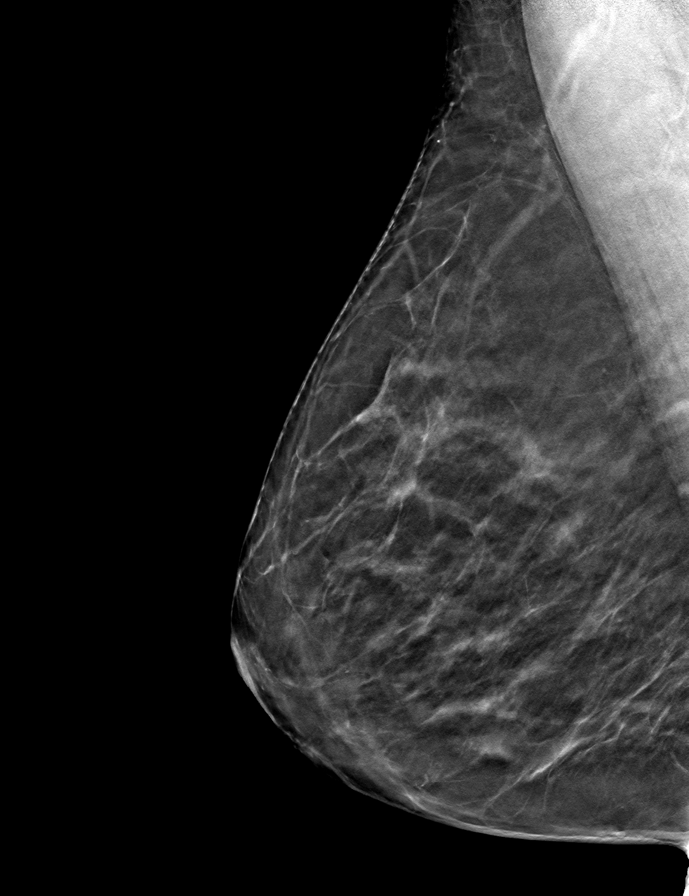

[L CC tomo · tomo slice 31/60.0]
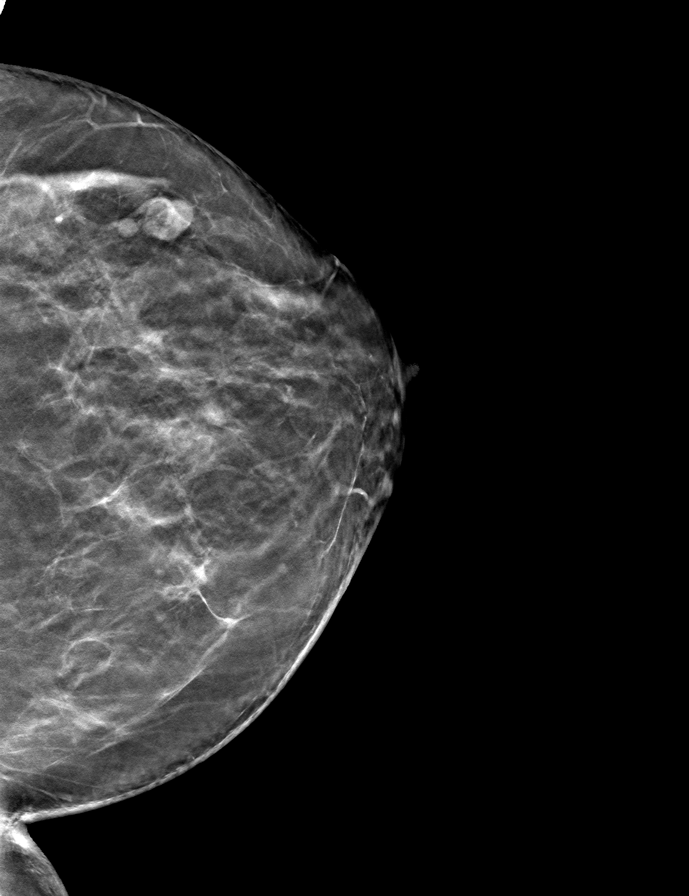

[R CC tomo · tomo slice 33/66.0]
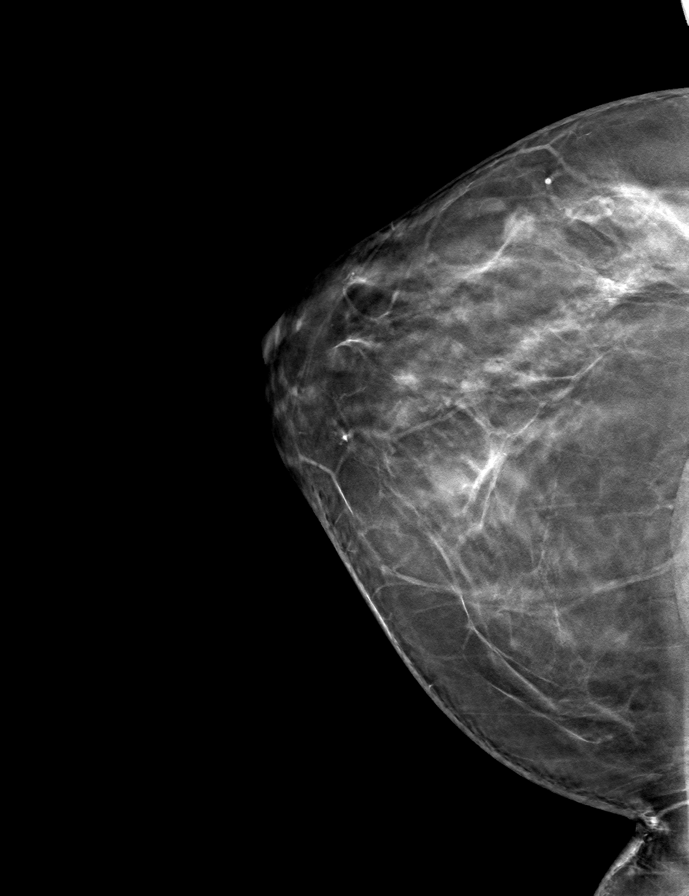

[L MLO tomo · tomo slice 31/61.0]
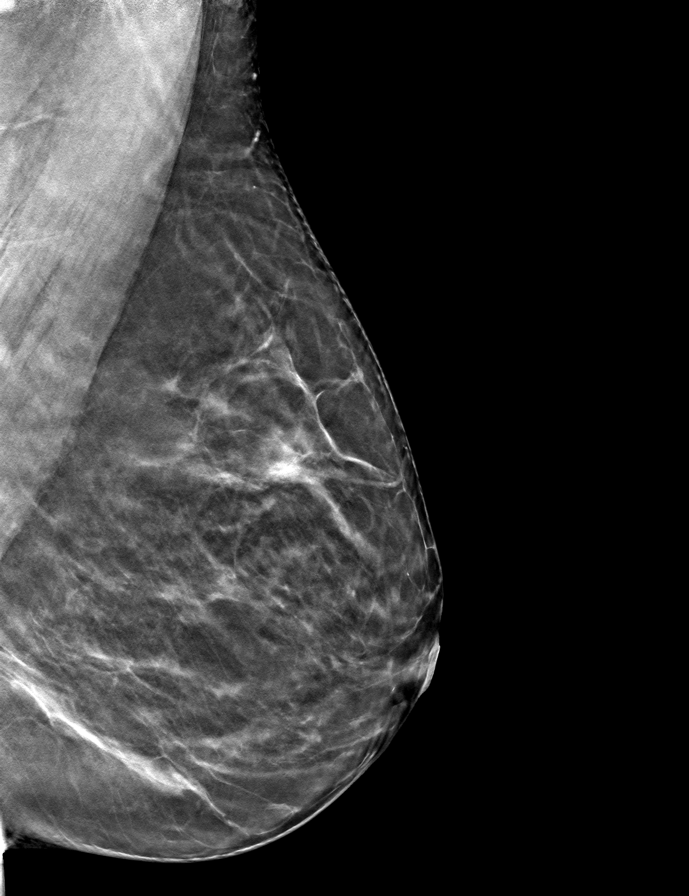

[8 of 24 positions shown; findings below may reference images not displayed]

ACR Breast Density Category b: There are scattered areas of
fibroglandular density.
FINDINGS: No suspicious mammographic findings are identified in either breast.
Circumscribed, equal density masses with suggestion of an internal
hilum are noted in the upper-outer quadrants bilaterally.
Confirmatory ultrasound was performed.

Mammographic images were processed with CAD.

Targeted ultrasound is performed, showing morphologically normal
intramammary lymph nodes within the bilateral breasts. This includes
a lymph node at the 8 o'clock position 4 cm from the nipple on the
right and the 2 o'clock position 3 cm from the nipple on the left.
These correspond with the mammographic finding.
IMPRESSION: No suspicious mammographic or sonographic findings.

RECOMMENDATION:
1. Clinical follow-up recommended for the diffusely painful area of
concern in the left breast. Any further workup should be based on
clinical grounds.
2.  Screening mammogram in one year.(Code:39-2-8SC)

I have discussed the findings and recommendations with the patient.
Results were also provided in writing at the conclusion of the
visit. If applicable, a reminder letter will be sent to the patient
regarding the next appointment.

BI-RADS CATEGORY  2: Benign.

## 2019-08-04 ENCOUNTER — Telehealth: Payer: Self-pay | Admitting: Licensed Clinical Social Worker

## 2019-08-04 NOTE — Telephone Encounter (Signed)
Patient called requesting a letter from Dr. Amil Amen which states she is high risk for COVID-19 and should minimize exposure.  Patient states she would like the letter today.  SWK told patient that these letters typically can take 24-48 hours and Dr. Amil Amen had a full schedule today.  Patient states she needs the letter for unemployment.  SWK told patient I would relay the message and to call tomorrow about the status of her request.

## 2019-08-05 NOTE — Telephone Encounter (Signed)
Patient called to check on the status of the letter request was made yesterday. Patient stated was talking to [place] on Wednesday about it and yesterday spoke with [place] who advised her to call today to get update on the status of her request.

## 2019-08-08 NOTE — Telephone Encounter (Signed)
Spoke with patient . Informed Dr. Amil Amen would not be able to provide a letter. Patient verbalized understanding.

## 2019-08-25 ENCOUNTER — Telehealth: Payer: Self-pay | Admitting: Internal Medicine

## 2019-08-25 NOTE — Telephone Encounter (Signed)
Patient called stating has been having sharp lower back and stomach pain really bad since Monday. Patient stated has not take anything yet to help with the pain and denied any fever, vomiting or diarrhea associated with it.   Please advise.

## 2019-08-25 NOTE — Telephone Encounter (Signed)
Spoke with patient states she has been having lower abdominal pain and really bad lower back pain x 3 days. States she has been having a hard time having a bowel movement and when she goes its not a lot. States he is always regular. Per Dr. Amil Amen have patient do metamucil in 8 oz of water daily and try colace 100 mg twice a day and see if that helps. Informed patient of message and told her to call me Monday with update if not better will bring her in for acute appointment. Patient verbalized understanding.

## 2019-09-01 ENCOUNTER — Other Ambulatory Visit: Payer: Medicaid Other

## 2019-09-05 ENCOUNTER — Ambulatory Visit: Payer: Medicaid Other | Admitting: Internal Medicine

## 2019-09-07 ENCOUNTER — Other Ambulatory Visit: Payer: Medicaid Other

## 2019-09-19 ENCOUNTER — Telehealth: Payer: Self-pay | Admitting: Licensed Clinical Social Worker

## 2019-09-19 NOTE — Telephone Encounter (Signed)
Contacted patient to inform patient of [place] last day of employment, to terminate services and give options for continuation of care. Options for continuation of care/termination are:  . Place patient on Wait List to make an appointment with one the [facility] interns, Rudene Anda or Danae Chen in January 2021. Marland Kitchen Place patient on wait list for new LCSW. Marland Kitchen Refer patient to outside therapist or agency. . Termination of mental health service with the clinic, at this time   Patient voicemail full unable to leave a message

## 2019-09-19 NOTE — Telephone Encounter (Signed)
done

## 2019-09-28 ENCOUNTER — Ambulatory Visit: Payer: Medicaid Other | Admitting: Internal Medicine

## 2019-09-28 ENCOUNTER — Other Ambulatory Visit: Payer: Self-pay

## 2019-09-28 ENCOUNTER — Encounter: Payer: Self-pay | Admitting: Internal Medicine

## 2019-09-28 VITALS — BP 122/80 | HR 76 | Resp 12 | Ht 64.0 in | Wt 157.0 lb

## 2019-09-28 DIAGNOSIS — N898 Other specified noninflammatory disorders of vagina: Secondary | ICD-10-CM

## 2019-09-28 LAB — POCT WET PREP WITH KOH
KOH Prep POC: NEGATIVE
RBC Wet Prep HPF POC: NEGATIVE
Trichomonas, UA: NEGATIVE
Yeast Wet Prep HPF POC: NEGATIVE

## 2019-09-28 MED ORDER — METRONIDAZOLE 500 MG PO TABS: ORAL_TABLET | ORAL | 0 refills | Status: DC

## 2019-09-28 NOTE — Progress Notes (Signed)
    Subjective:    Patient ID: Carly Flores, female   DOB: [birth year], 54 y.o.   MRN: [id number]   HPI   Vaginal bump and irritation on right side.  Sounds external.  She has had a Bartholin cyst before and states does not feel like this. She thinks the bump may have opened and drained with application of hot compress and she did wipe some bloody fluid away.   Irritated by underwear. She has not been sexually active for about 1.5 years, but states her female partner had HSV.  She states she was tested for herpes at the time--likely testing for antibodies as she never had any genital lesions-and was negative.  Current Meds  Medication Sig  . albuterol (PROVENTIL HFA;VENTOLIN HFA) 108 (90 Base) MCG/ACT inhaler Inhale 2 puffs into the lungs every 4 (four) hours as needed for wheezing or shortness of breath.  Marland Kitchen albuterol (PROVENTIL) (2.5 MG/3ML) 0.083% nebulizer solution Take 2.5 mg by nebulization every 6 (six) hours as needed for wheezing or shortness of breath.  Marland Kitchen amLODipine (NORVASC) 5 MG tablet Take 1 tablet (5 mg total) by mouth daily.  . Blood Glucose Monitoring Suppl (ACCU-CHEK AVIVA PLUS) w/Device KIT Check blood glucose twice daily before meals  . cetirizine (ZYRTEC) 10 MG tablet 1 tab by mouth daily for allergies  . ferrous sulfate 325 (65 FE) MG tablet Take 325 mg by mouth daily with breakfast.  . fluticasone (FLONASE) 50 MCG/ACT nasal spray Place 2 sprays into both nostrils daily.  . fluticasone (FLOVENT HFA) 44 MCG/ACT inhaler Inhale 2 puffs into the lungs 2 (two) times daily.  Marland Kitchen glipiZIDE (GLUCOTROL) 5 MG tablet 1 tab by mouth twice daily with meals  . glucose blood (ACCU-CHEK AVIVA PLUS) test strip Check blood sugar twice daily before meals.  . Lancets (ACCU-CHEK MULTICLIX) lancets Check blood glucose twice daily before meals  . metFORMIN (GLUCOPHAGE-XR) 500 MG 24 hr tablet 1 tab by mouth twice daily with meals  . montelukast (SINGULAIR) 10 MG tablet Take 1 tablet (10 mg total)  by mouth at bedtime.  . Multiple Vitamin (MULTIVITAMIN) tablet Take 1 tablet by mouth daily.  . Olopatadine HCl 0.2 % SOLN 1 drop both eyes once daily.  . valACYclovir (VALTREX) 500 MG tablet Take 1 tablet (500 mg total) by mouth 2 (two) times daily.  Marland Kitchen VITAMIN D PO Take 10,000 Units by mouth daily.   Allergies  Allergen Reactions  . Codeine   . Tylenol [Acetaminophen]      Review of Systems    Objective:   BP 122/80 (BP Location: Left Arm, Patient Position: Sitting, Cuff Size: Normal)   Pulse 76   Resp 12   Ht '5\' 4"'$  (1.626 m)   Wt 157 lb (71.2 kg)   LMP  (LMP Unknown)   BMI 26.95 kg/m   Physical Exam  NAD GU:  Normal female external genitalia.  No lesion noted internally or externally.  Fair amount of thin white gray discharge without underlying erythema/inflammation.  No CMT.  No odor.  Wet prep with clue cells and + whiff  Assessment & Plan   1.  Vaginal discharge:  Not sure of "bump", but likely resolved ingrown hair.  Currently with bacterial vaginosis, which may be maintaining irritation in vulvar area. Metronidazole 500 mg twice daily for 7 days. Call if irritation does not resolve with treatment  Needs follow up for chronic health issues.  Scheduled

## 2019-09-28 NOTE — Patient Instructions (Signed)
Call if symptoms do not improve.

## 2019-11-28 ENCOUNTER — Ambulatory Visit: Payer: Medicaid Other | Admitting: Internal Medicine

## 2019-11-30 ENCOUNTER — Encounter: Payer: Self-pay | Admitting: Internal Medicine

## 2019-11-30 ENCOUNTER — Ambulatory Visit (INDEPENDENT_AMBULATORY_CARE_PROVIDER_SITE_OTHER): Payer: Medicaid Other | Admitting: Internal Medicine

## 2019-11-30 ENCOUNTER — Other Ambulatory Visit: Payer: Self-pay

## 2019-11-30 VITALS — BP 140/83 | HR 90 | Resp 12 | Ht 64.0 in | Wt 154.0 lb

## 2019-11-30 DIAGNOSIS — Z9229 Personal history of other drug therapy: Secondary | ICD-10-CM

## 2019-11-30 DIAGNOSIS — E119 Type 2 diabetes mellitus without complications: Secondary | ICD-10-CM | POA: Insufficient documentation

## 2019-11-30 DIAGNOSIS — I10 Essential (primary) hypertension: Secondary | ICD-10-CM

## 2019-11-30 DIAGNOSIS — J3089 Other allergic rhinitis: Secondary | ICD-10-CM | POA: Diagnosis not present

## 2019-11-30 DIAGNOSIS — H93291 Other abnormal auditory perceptions, right ear: Secondary | ICD-10-CM

## 2019-11-30 DIAGNOSIS — J4540 Moderate persistent asthma, uncomplicated: Secondary | ICD-10-CM

## 2019-11-30 MED ORDER — ACCU-CHEK AVIVA PLUS VI STRP: ORAL_STRIP | 11 refills | Status: AC

## 2019-11-30 MED ORDER — FERROUS SULFATE 325 (65 FE) MG PO TABS: 325.0000 mg | ORAL_TABLET | Freq: Every day | ORAL | 11 refills | Status: AC

## 2019-11-30 MED ORDER — GLIPIZIDE 5 MG PO TABS: ORAL_TABLET | ORAL | 11 refills | Status: AC

## 2019-11-30 MED ORDER — VALACYCLOVIR HCL 500 MG PO TABS: 500.0000 mg | ORAL_TABLET | Freq: Two times a day (BID) | ORAL | 11 refills | Status: AC

## 2019-11-30 MED ORDER — FLUTICASONE PROPIONATE 50 MCG/ACT NA SUSP: 2.0000 | Freq: Every day | NASAL | 11 refills | Status: AC

## 2019-11-30 MED ORDER — MONTELUKAST SODIUM 10 MG PO TABS: 10.0000 mg | ORAL_TABLET | Freq: Every day | ORAL | 11 refills | Status: AC

## 2019-11-30 MED ORDER — METFORMIN HCL ER 500 MG PO TB24: ORAL_TABLET | ORAL | 11 refills | Status: AC

## 2019-11-30 MED ORDER — ACCU-CHEK MULTICLIX LANCETS MISC: 11 refills | Status: AC

## 2019-11-30 MED ORDER — OLOPATADINE HCL 0.2 % OP SOLN: OPHTHALMIC | 11 refills | Status: AC

## 2019-11-30 MED ORDER — FLUTICASONE PROPIONATE HFA 44 MCG/ACT IN AERO: 2.0000 | INHALATION_SPRAY | Freq: Two times a day (BID) | RESPIRATORY_TRACT | 11 refills | Status: AC

## 2019-11-30 MED ORDER — CETIRIZINE HCL 10 MG PO TABS: ORAL_TABLET | ORAL | 11 refills | Status: AC

## 2019-11-30 MED ORDER — AMLODIPINE BESYLATE 5 MG PO TABS: 5.0000 mg | ORAL_TABLET | Freq: Every day | ORAL | 11 refills | Status: AC

## 2019-11-30 NOTE — Progress Notes (Signed)
Subjective:    Patient ID: Carly Flores, female   DOB: 14-Sep-1966, 54 y.o.   MRN: [id number]   HPI   1.  DM:  Checking sugars twice daily.  Generally 60-120.  States her eating was off in December and January.  Did not have the ability to buy healthier foods during that time period.  Now back to eating better as has her unemployment back.   Last eye check was October 2020 and no diabetic changes.   2.  Hypertension:  Up a bit as was stressed when first came in.  3.  COVID19 vaccine:  She is skeptical.  Discussed benefits and risks at length.  Still not interested in getting until she sees how people do with vaccine 1 year after receiving.  4.  Asthma and allergies:  Still with coughing here and there.  Has not used the rescue inhaler in months.  5.  Right ear:  Still feels like something moving around in her ear.  Can itch, but does not perform any instrumentation of her ear canal.   Eyes burn and hurt at times.  She is post menopausal. She also has allergies as above.  Not clear which this is related to.    Current Meds  Medication Sig  . albuterol (PROVENTIL HFA;VENTOLIN HFA) 108 (90 Base) MCG/ACT inhaler Inhale 2 puffs into the lungs every 4 (four) hours as needed for wheezing or shortness of breath.  Marland Kitchen amLODipine (NORVASC) 5 MG tablet Take 1 tablet (5 mg total) by mouth daily.  . Blood Glucose Monitoring Suppl (ACCU-CHEK AVIVA PLUS) w/Device KIT Check blood glucose twice daily before meals  . cetirizine (ZYRTEC) 10 MG tablet 1 tab by mouth daily for allergies  . ferrous sulfate 325 (65 FE) MG tablet Take 325 mg by mouth daily with breakfast.  . fluticasone (FLONASE) 50 MCG/ACT nasal spray Place 2 sprays into both nostrils daily.  . fluticasone (FLOVENT HFA) 44 MCG/ACT inhaler Inhale 2 puffs into the lungs 2 (two) times daily.  Marland Kitchen glipiZIDE (GLUCOTROL) 5 MG tablet 1 tab by mouth twice daily with meals  . glucose blood (ACCU-CHEK AVIVA PLUS) test strip Check blood sugar twice  daily before meals.  . Lancets (ACCU-CHEK MULTICLIX) lancets Check blood glucose twice daily before meals  . meclizine (ANTIVERT) 25 MG tablet Take 25 mg by mouth 3 (three) times daily as needed for dizziness.  . metFORMIN (GLUCOPHAGE-XR) 500 MG 24 hr tablet 1 tab by mouth twice daily with meals  . montelukast (SINGULAIR) 10 MG tablet Take 1 tablet (10 mg total) by mouth at bedtime.  . Multiple Vitamin (MULTIVITAMIN) tablet Take 1 tablet by mouth daily.  . Olopatadine HCl 0.2 % SOLN 1 drop both eyes once daily.  . valACYclovir (VALTREX) 500 MG tablet Take 1 tablet (500 mg total) by mouth 2 (two) times daily.  Marland Kitchen VITAMIN D PO Take 10,000 Units by mouth daily.   Allergies  Allergen Reactions  . Codeine   . Tylenol [Acetaminophen]      Review of Systems    Objective:   BP (!) 142/80 (BP Location: Left Arm, Patient Position: Sitting, Cuff Size: Normal)   Pulse 90   Resp 12   Ht _0  (1.626 m)   Wt 154 lb (69.9 kg)   LMP  (LMP Unknown)   BMI 26.43 kg/m   Physical Exam  NAD HEENT:  PERRL, EOMI, no conjunctival injection.  TMs pearly gray.  Canals a bit dry.  Nasal mucosa a  bit boggy Neck:  Supple, No adenopathy, no thyromegaly Chest:  CTA CV:  RRR with normal S1 and S2.  No S3, S4 or murmur.  No carotid bruits.  Carotid, radial and DP pulses normal and equal. Abd:  S, NT, No HSM or mass, + BS LE:  No edema.  Diabetic foot exam was performed with the following findings:   No deformities, ulcerations, or other skin breakdown Normal sensation of 10g monofilament Intact posterior tibialis and dorsalis pedis pulses      Assessment & Plan  1.  DM  Plan to see what A1C shows before considering weaning of glipizide.  Well controlled by history  2.  Hypertension:  BP up a bit today with stress, but has been well controlled previously.  CMP  3.  Asthma and allergies:  Well controlled, though ear complaints likely from allergies.  To call if does not improve with time.  Meds  refilled.  4.  COVID Vaccination:  Encouraged her and family to get vaccinated.  CDC.gov for more information.

## 2019-12-01 LAB — COMPREHENSIVE METABOLIC PANEL
ALT: 15 IU/L (ref 0–32)
AST: 17 IU/L (ref 0–40)
Albumin/Globulin Ratio: 1.4 (ref 1.2–2.2)
Albumin: 4.2 g/dL (ref 3.8–4.9)
Alkaline Phosphatase: 111 IU/L (ref 39–117)
BUN/Creatinine Ratio: 13 (ref 9–23)
BUN: 10 mg/dL (ref 6–24)
Bilirubin Total: 0.4 mg/dL (ref 0.0–1.2)
CO2: 26 mmol/L (ref 20–29)
Calcium: 9.7 mg/dL (ref 8.7–10.2)
Chloride: 102 mmol/L (ref 96–106)
Creatinine, Ser: 0.79 mg/dL (ref 0.57–1.00)
GFR calc Af Amer: 97 mL/min/{1.73_m2} (ref >59)
GFR calc non Af Amer: 85 mL/min/{1.73_m2} (ref >59)
Globulin, Total: 2.9 g/dL (ref 1.5–4.5)
Glucose: 121 mg/dL — ABNORMAL HIGH (ref 65–99)
Potassium: 4.2 mmol/L (ref 3.5–5.2)
Sodium: 143 mmol/L (ref 134–144)
Total Protein: 7.1 g/dL (ref 6.0–8.5)

## 2019-12-01 LAB — HGB A1C W/O EAG: Hgb A1c MFr Bld: 5.6 % (ref 4.8–5.6)

## 2020-03-13 ENCOUNTER — Ambulatory Visit: Payer: Medicaid Other | Admitting: Internal Medicine

## 2020-03-13 ENCOUNTER — Encounter: Payer: Self-pay | Admitting: Internal Medicine

## 2020-03-13 VITALS — BP 130/82 | HR 88 | Resp 12 | Ht 64.0 in | Wt 155.0 lb

## 2020-03-13 DIAGNOSIS — R059 Cough, unspecified: Secondary | ICD-10-CM

## 2020-03-13 DIAGNOSIS — B370 Candidal stomatitis: Secondary | ICD-10-CM | POA: Diagnosis not present

## 2020-03-13 DIAGNOSIS — R05 Cough: Secondary | ICD-10-CM | POA: Diagnosis not present

## 2020-03-13 MED ORDER — FLUCONAZOLE 100 MG PO TABS: ORAL_TABLET | ORAL | 0 refills | Status: AC

## 2020-03-13 MED ORDER — BENZONATATE 100 MG PO CAPS: 100.0000 mg | ORAL_CAPSULE | Freq: Two times a day (BID) | ORAL | 0 refills | Status: AC | PRN
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
## 2020-03-19 ENCOUNTER — Other Ambulatory Visit (INDEPENDENT_AMBULATORY_CARE_PROVIDER_SITE_OTHER): Payer: Medicaid Other

## 2020-03-19 DIAGNOSIS — E119 Type 2 diabetes mellitus without complications: Secondary | ICD-10-CM

## 2020-03-19 DIAGNOSIS — Z79899 Other long term (current) drug therapy: Secondary | ICD-10-CM

## 2020-03-19 DIAGNOSIS — Z7251 High risk heterosexual behavior: Secondary | ICD-10-CM

## 2020-03-19 DIAGNOSIS — E7800 Pure hypercholesterolemia, unspecified: Secondary | ICD-10-CM

## 2020-03-20 LAB — COMPREHENSIVE METABOLIC PANEL
ALT: 11 IU/L (ref 0–32)
AST: 14 IU/L (ref 0–40)
Albumin/Globulin Ratio: 1.7 (ref 1.2–2.2)
Albumin: 4.5 g/dL (ref 3.8–4.9)
Alkaline Phosphatase: 131 IU/L — ABNORMAL HIGH (ref 48–121)
BUN/Creatinine Ratio: 15 (ref 9–23)
BUN: 13 mg/dL (ref 6–24)
Bilirubin Total: 0.6 mg/dL (ref 0.0–1.2)
CO2: 25 mmol/L (ref 20–29)
Calcium: 9.6 mg/dL (ref 8.7–10.2)
Chloride: 101 mmol/L (ref 96–106)
Creatinine, Ser: 0.84 mg/dL (ref 0.57–1.00)
GFR calc Af Amer: 90 mL/min/{1.73_m2} (ref >59)
GFR calc non Af Amer: 78 mL/min/{1.73_m2} (ref >59)
Globulin, Total: 2.7 g/dL (ref 1.5–4.5)
Glucose: 107 mg/dL — ABNORMAL HIGH (ref 65–99)
Potassium: 4.6 mmol/L (ref 3.5–5.2)
Sodium: 140 mmol/L (ref 134–144)
Total Protein: 7.2 g/dL (ref 6.0–8.5)

## 2020-03-20 LAB — LIPID PANEL W/O CHOL/HDL RATIO
Cholesterol, Total: 191 mg/dL (ref 100–199)
HDL: 37 mg/dL — ABNORMAL LOW (ref >39)
LDL Chol Calc (NIH): 127 mg/dL — ABNORMAL HIGH (ref 0–99)
Triglycerides: 150 mg/dL — ABNORMAL HIGH (ref 0–149)
VLDL Cholesterol Cal: 27 mg/dL (ref 5–40)

## 2020-03-20 LAB — HEPATITIS C ANTIBODY: Hep C Virus Ab: <0.1 s/co ratio (ref 0.0–0.9)

## 2020-03-20 LAB — HIV ANTIBODY (ROUTINE TESTING W REFLEX): HIV Screen 4th Generation wRfx: NONREACTIVE

## 2020-03-20 LAB — HGB A1C W/O EAG: Hgb A1c MFr Bld: 6.4 % — ABNORMAL HIGH (ref 4.8–5.6)

## 2020-03-30 ENCOUNTER — Other Ambulatory Visit: Payer: Medicaid Other

## 2020-04-03 ENCOUNTER — Encounter: Payer: Medicaid Other | Admitting: Internal Medicine
